# Patient Record
Sex: Female | Born: 1935 | Race: White | Hispanic: No
Health system: Southern US, Community
[De-identification: ages and names within clinical notes are randomized; demographics above are authoritative.]

---

## 2000-05-12 ENCOUNTER — Encounter: Payer: Self-pay | Admitting: Thoracic Surgery

## 2000-05-14 ENCOUNTER — Ambulatory Visit (HOSPITAL_COMMUNITY): Admission: RE | Admit: 2000-05-14 | Discharge: 2000-05-14 | Payer: Self-pay | Admitting: Thoracic Surgery

## 2000-05-23 ENCOUNTER — Encounter: Admission: RE | Admit: 2000-05-23 | Discharge: 2000-08-21 | Payer: Self-pay

## 2000-06-23 ENCOUNTER — Encounter (HOSPITAL_COMMUNITY): Admission: RE | Admit: 2000-06-23 | Discharge: 2000-07-23 | Payer: Self-pay | Admitting: Oncology

## 2000-06-23 ENCOUNTER — Encounter: Admission: RE | Admit: 2000-06-23 | Discharge: 2000-06-23 | Payer: Self-pay | Admitting: Oncology

## 2000-07-22 ENCOUNTER — Encounter: Payer: Self-pay | Admitting: *Deleted

## 2000-07-22 ENCOUNTER — Ambulatory Visit (HOSPITAL_COMMUNITY): Admission: RE | Admit: 2000-07-22 | Discharge: 2000-07-22 | Payer: Self-pay | Admitting: *Deleted

## 2000-08-01 ENCOUNTER — Encounter: Admission: RE | Admit: 2000-08-01 | Discharge: 2000-08-01 | Payer: Self-pay | Admitting: Oncology

## 2000-08-01 ENCOUNTER — Encounter (HOSPITAL_COMMUNITY): Admission: RE | Admit: 2000-08-01 | Discharge: 2000-08-31 | Payer: Self-pay | Admitting: Oncology

## 2000-09-22 ENCOUNTER — Encounter (HOSPITAL_COMMUNITY): Admission: RE | Admit: 2000-09-22 | Discharge: 2000-10-22 | Payer: Self-pay | Admitting: Oncology

## 2000-09-22 ENCOUNTER — Encounter: Admission: RE | Admit: 2000-09-22 | Discharge: 2000-09-22 | Payer: Self-pay | Admitting: Oncology

## 2000-09-22 ENCOUNTER — Encounter (HOSPITAL_COMMUNITY): Payer: Self-pay | Admitting: Oncology

## 2000-12-12 ENCOUNTER — Encounter: Admission: RE | Admit: 2000-12-12 | Discharge: 2000-12-12 | Payer: Self-pay | Admitting: Oncology

## 2000-12-12 ENCOUNTER — Encounter (HOSPITAL_COMMUNITY): Admission: RE | Admit: 2000-12-12 | Discharge: 2001-01-11 | Payer: Self-pay | Admitting: Oncology

## 2000-12-12 ENCOUNTER — Encounter (HOSPITAL_COMMUNITY): Payer: Self-pay | Admitting: Oncology

## 2001-01-20 ENCOUNTER — Inpatient Hospital Stay (HOSPITAL_COMMUNITY): Admission: RE | Admit: 2001-01-20 | Discharge: 2001-01-26 | Payer: Self-pay | Admitting: General Surgery

## 2001-01-25 ENCOUNTER — Encounter: Payer: Self-pay | Admitting: Family Medicine

## 2001-03-13 ENCOUNTER — Encounter (HOSPITAL_COMMUNITY): Admission: RE | Admit: 2001-03-13 | Discharge: 2001-04-12 | Payer: Self-pay | Admitting: Oncology

## 2001-03-13 ENCOUNTER — Encounter: Admission: RE | Admit: 2001-03-13 | Discharge: 2001-03-13 | Payer: Self-pay | Admitting: Oncology

## 2001-03-13 ENCOUNTER — Encounter (HOSPITAL_COMMUNITY): Payer: Self-pay | Admitting: Oncology

## 2001-06-12 ENCOUNTER — Encounter: Admission: RE | Admit: 2001-06-12 | Discharge: 2001-06-12 | Payer: Self-pay | Admitting: Oncology

## 2001-06-12 ENCOUNTER — Encounter (HOSPITAL_COMMUNITY): Admission: RE | Admit: 2001-06-12 | Discharge: 2001-07-12 | Payer: Self-pay | Admitting: Oncology

## 2001-06-12 ENCOUNTER — Encounter (HOSPITAL_COMMUNITY): Payer: Self-pay | Admitting: Oncology

## 2001-08-10 ENCOUNTER — Encounter (HOSPITAL_COMMUNITY): Admission: RE | Admit: 2001-08-10 | Discharge: 2001-09-09 | Payer: Self-pay | Admitting: Oncology

## 2001-08-10 ENCOUNTER — Encounter (HOSPITAL_COMMUNITY): Payer: Self-pay | Admitting: Oncology

## 2001-11-11 ENCOUNTER — Encounter (HOSPITAL_COMMUNITY): Admission: RE | Admit: 2001-11-11 | Discharge: 2001-12-11 | Payer: Self-pay | Admitting: Oncology

## 2001-11-11 ENCOUNTER — Encounter: Admission: RE | Admit: 2001-11-11 | Discharge: 2001-11-11 | Payer: Self-pay | Admitting: Oncology

## 2001-11-11 ENCOUNTER — Encounter (HOSPITAL_COMMUNITY): Payer: Self-pay | Admitting: Oncology

## 2001-11-20 ENCOUNTER — Encounter (HOSPITAL_COMMUNITY): Payer: Self-pay | Admitting: Oncology

## 2002-02-09 ENCOUNTER — Encounter (HOSPITAL_COMMUNITY): Admission: RE | Admit: 2002-02-09 | Discharge: 2002-03-11 | Payer: Self-pay | Admitting: Oncology

## 2002-02-09 ENCOUNTER — Encounter: Admission: RE | Admit: 2002-02-09 | Discharge: 2002-02-09 | Payer: Self-pay | Admitting: Oncology

## 2002-02-09 ENCOUNTER — Encounter (HOSPITAL_COMMUNITY): Payer: Self-pay | Admitting: Oncology

## 2002-02-17 ENCOUNTER — Encounter (HOSPITAL_COMMUNITY): Payer: Self-pay | Admitting: Oncology

## 2002-03-30 ENCOUNTER — Encounter: Admission: RE | Admit: 2002-03-30 | Discharge: 2002-03-30 | Payer: Self-pay | Admitting: Oncology

## 2002-05-11 ENCOUNTER — Encounter (HOSPITAL_COMMUNITY): Admission: RE | Admit: 2002-05-11 | Discharge: 2002-06-10 | Payer: Self-pay | Admitting: Oncology

## 2002-05-11 ENCOUNTER — Encounter: Admission: RE | Admit: 2002-05-11 | Discharge: 2002-05-11 | Payer: Self-pay | Admitting: Oncology

## 2002-05-12 ENCOUNTER — Encounter (HOSPITAL_COMMUNITY): Payer: Self-pay | Admitting: Oncology

## 2002-06-30 ENCOUNTER — Ambulatory Visit (HOSPITAL_COMMUNITY): Admission: RE | Admit: 2002-06-30 | Discharge: 2002-06-30 | Payer: Self-pay | Admitting: Family Medicine

## 2002-06-30 ENCOUNTER — Encounter: Payer: Self-pay | Admitting: Family Medicine

## 2002-07-06 ENCOUNTER — Ambulatory Visit (HOSPITAL_COMMUNITY): Admission: RE | Admit: 2002-07-06 | Discharge: 2002-07-06 | Payer: Self-pay | Admitting: Family Medicine

## 2002-07-06 ENCOUNTER — Encounter: Payer: Self-pay | Admitting: Family Medicine

## 2002-07-21 ENCOUNTER — Encounter: Payer: Self-pay | Admitting: Urology

## 2002-07-21 ENCOUNTER — Ambulatory Visit (HOSPITAL_COMMUNITY): Admission: RE | Admit: 2002-07-21 | Discharge: 2002-07-21 | Payer: Self-pay | Admitting: Urology

## 2002-07-31 ENCOUNTER — Inpatient Hospital Stay (HOSPITAL_COMMUNITY): Admission: EM | Admit: 2002-07-31 | Discharge: 2002-08-06 | Payer: Self-pay | Admitting: Emergency Medicine

## 2002-07-31 ENCOUNTER — Encounter: Payer: Self-pay | Admitting: Emergency Medicine

## 2002-08-04 ENCOUNTER — Encounter: Payer: Self-pay | Admitting: Family Medicine

## 2002-09-08 ENCOUNTER — Encounter: Admission: RE | Admit: 2002-09-08 | Discharge: 2002-09-08 | Payer: Self-pay | Admitting: Oncology

## 2002-09-08 ENCOUNTER — Encounter (HOSPITAL_COMMUNITY): Payer: Self-pay | Admitting: Oncology

## 2002-09-08 ENCOUNTER — Encounter (HOSPITAL_COMMUNITY): Admission: RE | Admit: 2002-09-08 | Discharge: 2002-10-08 | Payer: Self-pay | Admitting: Oncology

## 2002-10-29 ENCOUNTER — Encounter: Payer: Self-pay | Admitting: Family Medicine

## 2002-10-29 ENCOUNTER — Ambulatory Visit (HOSPITAL_COMMUNITY): Admission: RE | Admit: 2002-10-29 | Discharge: 2002-10-29 | Payer: Self-pay | Admitting: Family Medicine

## 2002-12-15 ENCOUNTER — Encounter (HOSPITAL_COMMUNITY): Admission: RE | Admit: 2002-12-15 | Discharge: 2003-01-14 | Payer: Self-pay | Admitting: Oncology

## 2002-12-15 ENCOUNTER — Encounter: Admission: RE | Admit: 2002-12-15 | Discharge: 2002-12-15 | Payer: Self-pay | Admitting: Oncology

## 2002-12-20 ENCOUNTER — Encounter (HOSPITAL_COMMUNITY): Payer: Self-pay | Admitting: Oncology

## 2002-12-27 ENCOUNTER — Ambulatory Visit (HOSPITAL_COMMUNITY): Admission: RE | Admit: 2002-12-27 | Discharge: 2002-12-27 | Payer: Self-pay | Admitting: Internal Medicine

## 2002-12-27 ENCOUNTER — Encounter (INDEPENDENT_AMBULATORY_CARE_PROVIDER_SITE_OTHER): Payer: Self-pay | Admitting: Internal Medicine

## 2003-02-02 ENCOUNTER — Ambulatory Visit (HOSPITAL_COMMUNITY): Admission: RE | Admit: 2003-02-02 | Discharge: 2003-02-02 | Payer: Self-pay | Admitting: Internal Medicine

## 2003-03-23 ENCOUNTER — Ambulatory Visit (HOSPITAL_COMMUNITY): Admission: RE | Admit: 2003-03-23 | Discharge: 2003-03-23 | Payer: Self-pay | Admitting: Family Medicine

## 2003-04-11 ENCOUNTER — Encounter: Admission: RE | Admit: 2003-04-11 | Discharge: 2003-04-11 | Payer: Self-pay | Admitting: Oncology

## 2003-04-11 ENCOUNTER — Encounter (HOSPITAL_COMMUNITY): Admission: RE | Admit: 2003-04-11 | Discharge: 2003-05-11 | Payer: Self-pay | Admitting: Oncology

## 2003-05-11 ENCOUNTER — Inpatient Hospital Stay (HOSPITAL_COMMUNITY): Admission: EM | Admit: 2003-05-11 | Discharge: 2003-05-14 | Payer: Self-pay | Admitting: Emergency Medicine

## 2003-07-21 ENCOUNTER — Ambulatory Visit (HOSPITAL_COMMUNITY): Admission: RE | Admit: 2003-07-21 | Discharge: 2003-07-21 | Payer: Self-pay | Admitting: Internal Medicine

## 2003-08-02 ENCOUNTER — Ambulatory Visit (HOSPITAL_COMMUNITY): Admission: RE | Admit: 2003-08-02 | Discharge: 2003-08-02 | Payer: Self-pay | Admitting: Internal Medicine

## 2003-08-11 ENCOUNTER — Encounter: Admission: RE | Admit: 2003-08-11 | Discharge: 2003-08-11 | Payer: Self-pay | Admitting: Oncology

## 2003-08-11 ENCOUNTER — Encounter (HOSPITAL_COMMUNITY): Admission: RE | Admit: 2003-08-11 | Discharge: 2003-09-10 | Payer: Self-pay | Admitting: Oncology

## 2003-09-21 ENCOUNTER — Ambulatory Visit (HOSPITAL_COMMUNITY): Admission: RE | Admit: 2003-09-21 | Discharge: 2003-09-21 | Payer: Self-pay | Admitting: Family Medicine

## 2003-10-07 ENCOUNTER — Ambulatory Visit (HOSPITAL_COMMUNITY): Admission: RE | Admit: 2003-10-07 | Discharge: 2003-10-07 | Payer: Self-pay | Admitting: Internal Medicine

## 2004-02-01 ENCOUNTER — Ambulatory Visit (HOSPITAL_COMMUNITY): Admission: RE | Admit: 2004-02-01 | Discharge: 2004-02-01 | Payer: Self-pay | Admitting: Family Medicine

## 2004-02-14 ENCOUNTER — Encounter: Admission: RE | Admit: 2004-02-14 | Discharge: 2004-03-02 | Payer: Self-pay | Admitting: Oncology

## 2004-02-14 ENCOUNTER — Encounter (HOSPITAL_COMMUNITY): Admission: RE | Admit: 2004-02-14 | Discharge: 2004-03-02 | Payer: Self-pay | Admitting: Oncology

## 2004-02-17 ENCOUNTER — Ambulatory Visit (HOSPITAL_COMMUNITY): Payer: Self-pay | Admitting: Oncology

## 2004-03-13 ENCOUNTER — Encounter: Admission: RE | Admit: 2004-03-13 | Discharge: 2004-03-13 | Payer: Self-pay | Admitting: Oncology

## 2004-03-13 ENCOUNTER — Encounter (HOSPITAL_COMMUNITY): Admission: RE | Admit: 2004-03-13 | Discharge: 2004-04-12 | Payer: Self-pay | Admitting: Oncology

## 2004-03-19 ENCOUNTER — Inpatient Hospital Stay (HOSPITAL_COMMUNITY): Admission: EM | Admit: 2004-03-19 | Discharge: 2004-03-26 | Payer: Self-pay | Admitting: Emergency Medicine

## 2004-05-21 ENCOUNTER — Ambulatory Visit (HOSPITAL_COMMUNITY): Admission: RE | Admit: 2004-05-21 | Discharge: 2004-05-21 | Payer: Self-pay | Admitting: Internal Medicine

## 2004-05-25 ENCOUNTER — Ambulatory Visit (HOSPITAL_COMMUNITY): Admission: RE | Admit: 2004-05-25 | Discharge: 2004-05-25 | Payer: Self-pay | Admitting: Internal Medicine

## 2004-10-03 ENCOUNTER — Encounter: Admission: RE | Admit: 2004-10-03 | Discharge: 2004-10-03 | Payer: Self-pay | Admitting: Oncology

## 2004-10-03 ENCOUNTER — Ambulatory Visit (HOSPITAL_COMMUNITY): Payer: Self-pay | Admitting: Oncology

## 2004-10-03 ENCOUNTER — Encounter (HOSPITAL_COMMUNITY): Admission: RE | Admit: 2004-10-03 | Discharge: 2004-11-02 | Payer: Self-pay | Admitting: Oncology

## 2005-04-20 ENCOUNTER — Emergency Department (HOSPITAL_COMMUNITY): Admission: EM | Admit: 2005-04-20 | Discharge: 2005-04-21 | Payer: Self-pay | Admitting: Emergency Medicine

## 2005-06-04 ENCOUNTER — Encounter (HOSPITAL_COMMUNITY): Admission: RE | Admit: 2005-06-04 | Discharge: 2005-07-04 | Payer: Self-pay | Admitting: Oncology

## 2005-06-04 ENCOUNTER — Ambulatory Visit (HOSPITAL_COMMUNITY): Payer: Self-pay | Admitting: Oncology

## 2005-06-04 ENCOUNTER — Encounter: Admission: RE | Admit: 2005-06-04 | Discharge: 2005-06-04 | Payer: Self-pay | Admitting: Oncology

## 2005-09-25 ENCOUNTER — Inpatient Hospital Stay (HOSPITAL_COMMUNITY): Admission: AD | Admit: 2005-09-25 | Discharge: 2005-09-28 | Payer: Self-pay | Admitting: Family Medicine

## 2005-09-25 ENCOUNTER — Ambulatory Visit: Payer: Self-pay | Admitting: Gastroenterology

## 2005-12-05 IMAGING — CT CT CHEST W/ CM
3 of 4 series · 16 of 31 positions shown, 18 images · IV contrast (CONTRAST)
Comparison: 03/19/04.

CLINICAL DATA: Lung cancer.  Hypertension.
CHEST CT WITH CONTRAST:
TECHNIQUE: Multidetector CT imaging of the chest was performed following the standard protocol during bolus administration of intravenous contrast.
Contrast: 150 cc Omnipaque 300
TECHNIQUE: Multidetector CT imaging of the abdomen was performed following the standard protocol during bolus administration of intravenous contrast.

[Series 6149: — · axial · 0.79mm/px · z∈[+1429,+1654]mm · 6 of 64 slices shown, 8 images (1 of 3)]
[im 10/64  mediastinal]
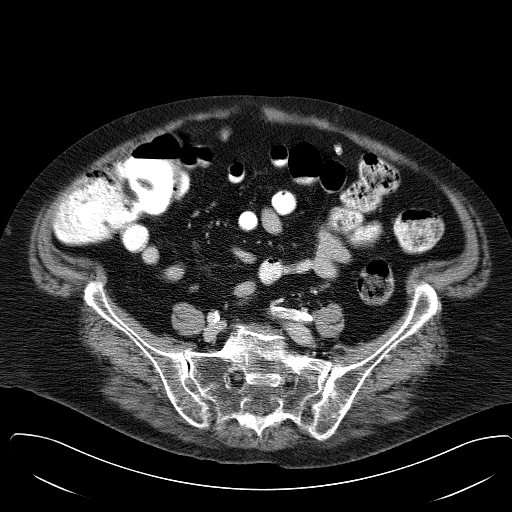
[im 10/64  lung]
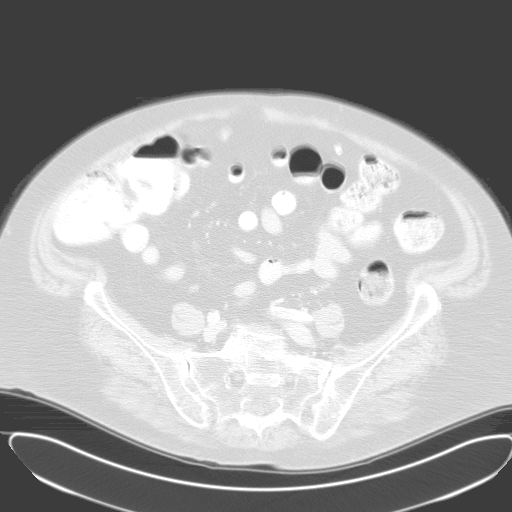
[im 19/64  lung]
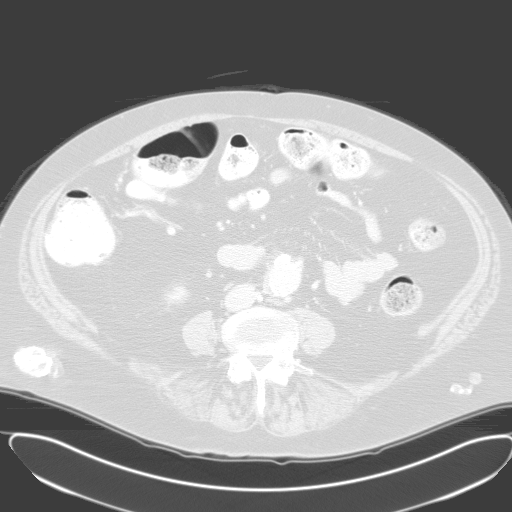
[im 28/64  lung]
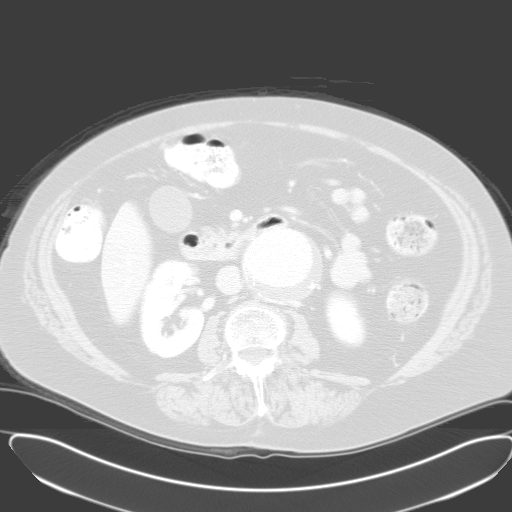
[im 37/64  lung]
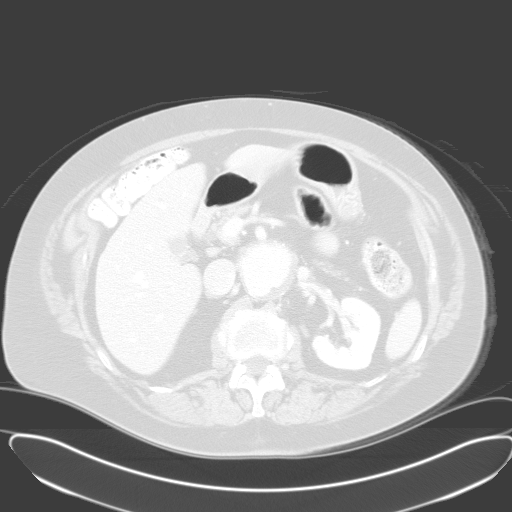
[im 46/64  mediastinal]
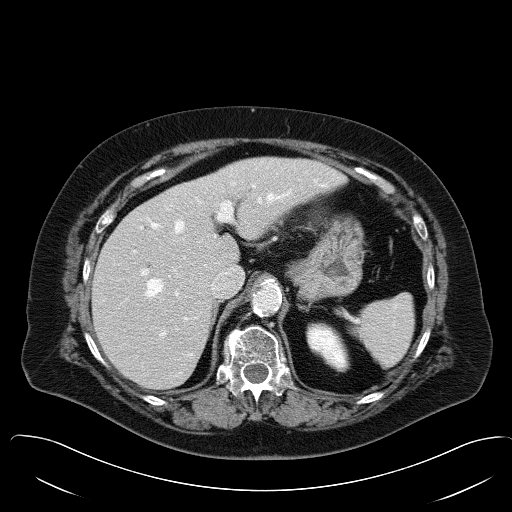
[im 46/64  lung]
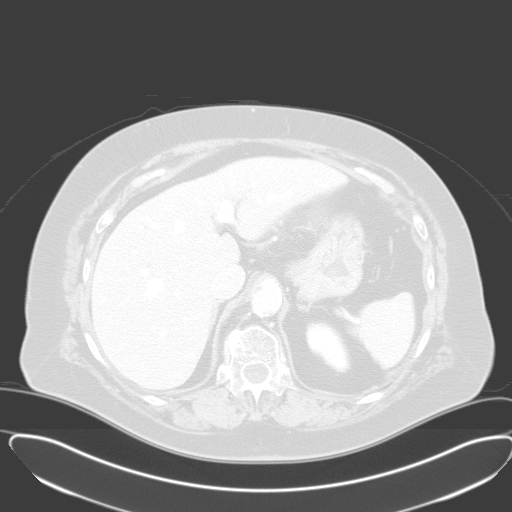
[im 55/64  lung]
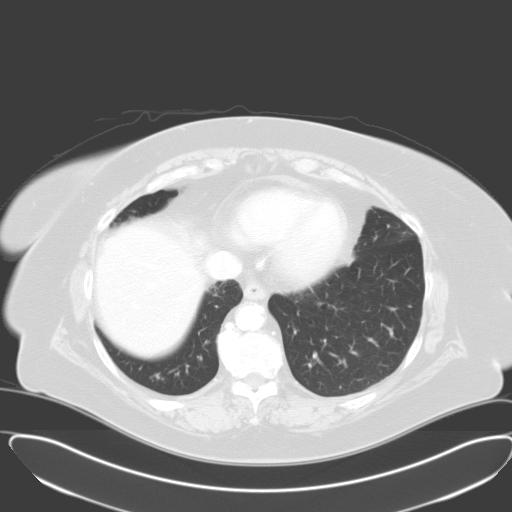

[Series 6150: — · axial · 0.78mm/px · z∈[+1471,+1636]mm · 6 of 51 slices shown (2 of 3)]
[im 9/51  lung]
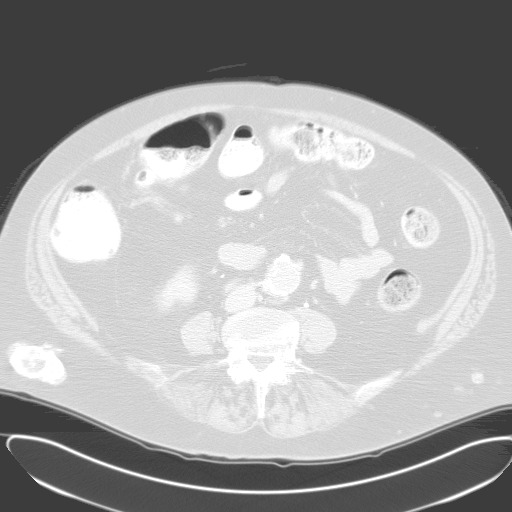
[im 17/51  lung]
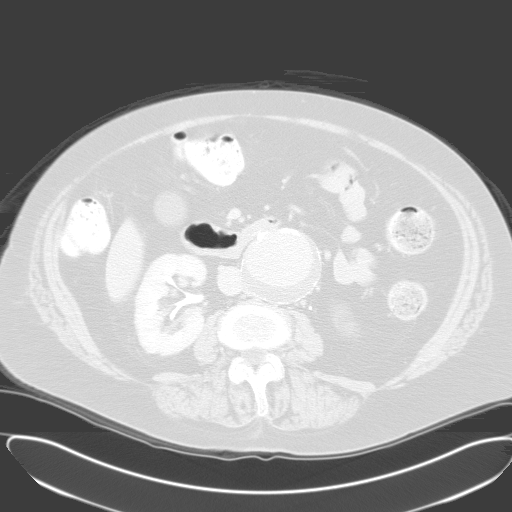
[im 26/51  lung]
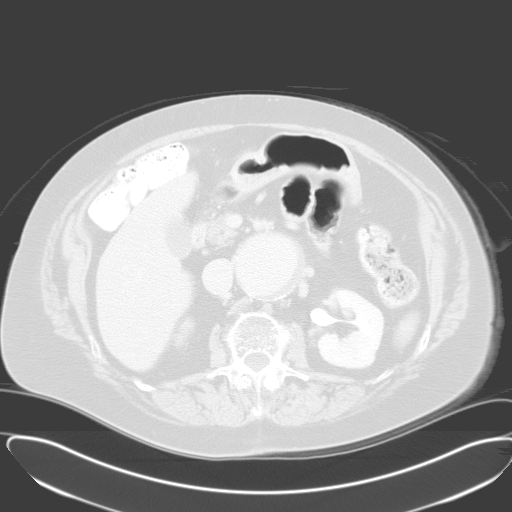
[im 34/51  lung]
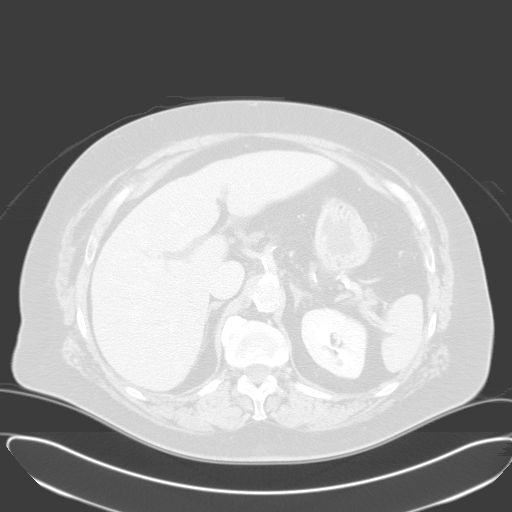
[im 37/51  lung]
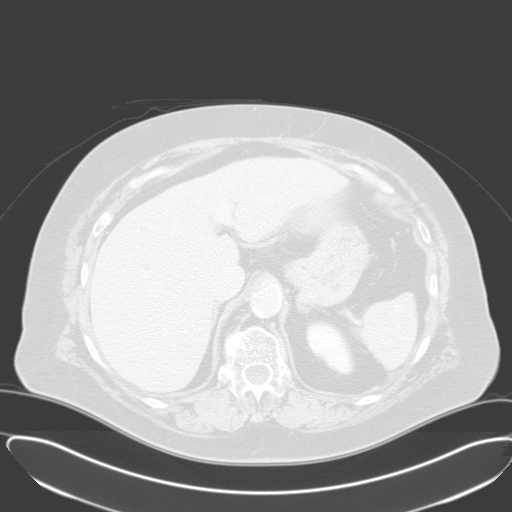
[im 42/51  lung]
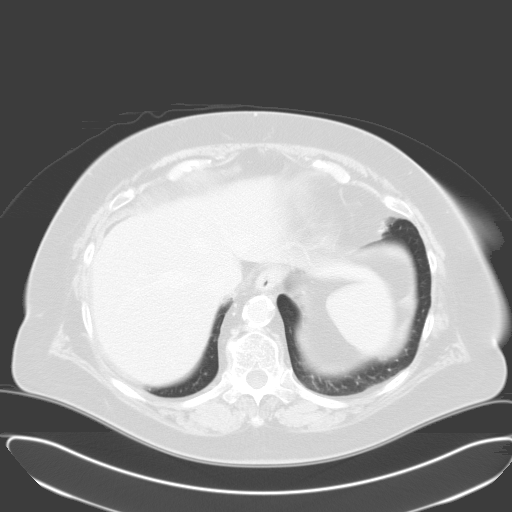

[Series 6151: — · axial · 0.65mm/px · z∈[+1612,+1726]mm · 4 of 51 slices shown (3 of 3)]
[im 3/51  lung]
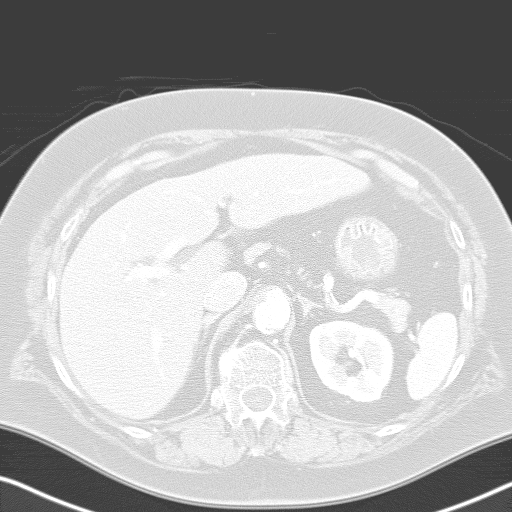
[im 9/51  lung]
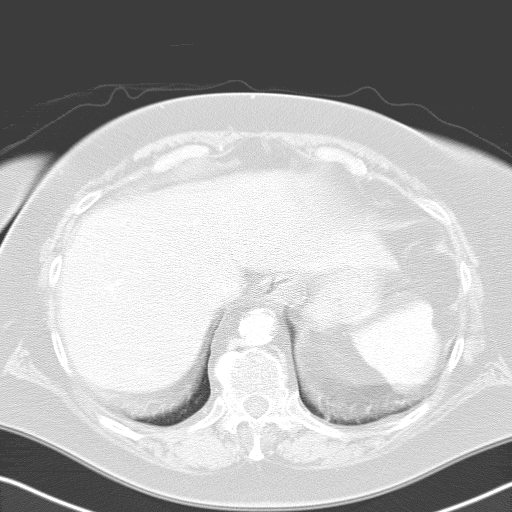
[im 17/51  lung]
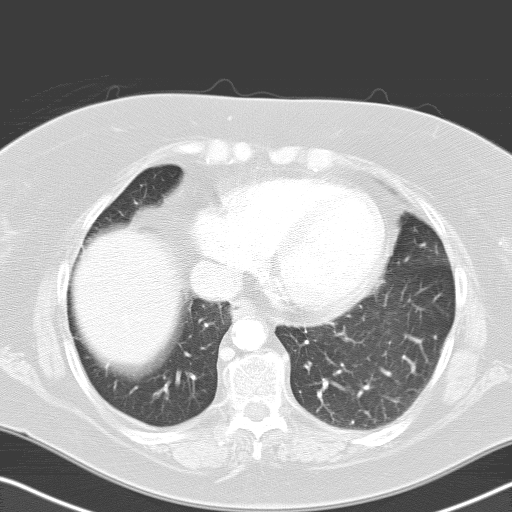
[im 26/51  lung]
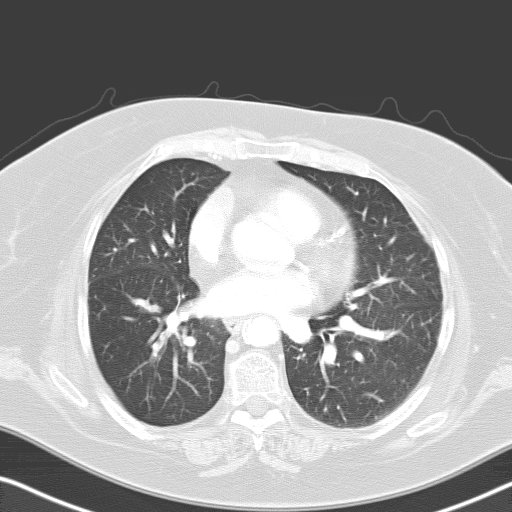

[16 of 31 positions shown; findings below may reference images not displayed]

Air bronchograms and a band-like appearance at the right lung apex appears characteristic for a radiation port, correlate with patient history. This overall appears similar to the prior exam.  There are several small adjacent mediastinal lymph nodes, likewise stable, and the largest with a short axis diameter of 10 mm.  There is mild prominence of the azygos vein which is likely incidental.  There is also evidence of coronary atherosclerotic vascular disease.  Mild airwave thickening is likely a manifestation of chronic bronchitis. Breathing motion artifact is present on lung  images, mildly reducing sensitivity.  No new pulmonary mass is evident.  No rib erosion is noted. There is some sclerosis of the right second and third ribs posteriorly, in the end of the fourth rib, in the expected region of the radiation port, and this is likewise likely due to suspected radiation port.
IMPRESSION: 1.  Band-like opacity at the right lung apex with associated air bronchograms.  Its linear appearance suggests a radiation port, as does the sclerosis and adjacent bony structures.  Correlate with patient history.  
2.  No pathologically enlarged nodes. 
3.  Coronary atherosclerosis.
ABDOMEN CT WITH CONTRAST:
FINDINGS: The infrarenal abdominal aortic aneurysm measures 6.1 mm (anterior-posterior) x 6.6 cm (transverse).  The aneurysm measurements previously was 5.8 cm x 6.6 cm.  Thus, this is essentially stable, although slightly greater than 6 cm in diameter. A small density anteriorly in the spleen is likely incidental.  The kidneys appear unchanged.  There is some borderline intrahepatic biliary dilatation in the right hepatic lobe. No adrenal mass is identified.
A subtle hypodense lesion in the medial segment left hepatic lobe on image 25, series 0326 measures 4 mm in diameter and is technically nonspecific due to its small size.  No retroperitoneal adenopathy.
IMPRESSION: 1.  Stable infrarenal abdominal aortic aneurysm at just over 6 cm in diameter.
2.  Borderline intrahepatic biliary duct dilatation in the right hepatic lobe, although no significant common bile duct dilatation is noted

## 2010-03-24 ENCOUNTER — Encounter (HOSPITAL_COMMUNITY): Payer: Self-pay | Admitting: Oncology

## 2010-03-25 ENCOUNTER — Encounter: Payer: Self-pay | Admitting: Vascular Surgery

## 2010-03-25 ENCOUNTER — Encounter (HOSPITAL_COMMUNITY): Payer: Self-pay | Admitting: Oncology

## 2014-06-28 ENCOUNTER — Telehealth: Payer: Self-pay | Admitting: *Deleted

## 2014-06-28 NOTE — Telephone Encounter (Signed)
Entered in error
